# Patient Record
Sex: Male | Born: 1995 | Race: White | Hispanic: No | Marital: Single | Smoking: Never smoker
Health system: Southern US, Community
[De-identification: ages and names within clinical notes are randomized; demographics above are authoritative.]

## PROBLEM LIST (undated history)

## (undated) DIAGNOSIS — Z0000 Encounter for general adult medical examination without abnormal findings: Principal | ICD-10-CM

## (undated) DIAGNOSIS — R61 Generalized hyperhidrosis: Secondary | ICD-10-CM

---

## 2018-01-11 ENCOUNTER — Encounter (HOSPITAL_COMMUNITY): Payer: Self-pay | Admitting: *Deleted

## 2018-01-11 ENCOUNTER — Other Ambulatory Visit: Payer: Self-pay

## 2018-01-11 ENCOUNTER — Emergency Department (HOSPITAL_COMMUNITY): Payer: Managed Care, Other (non HMO)

## 2018-01-11 ENCOUNTER — Emergency Department (HOSPITAL_COMMUNITY)
Admission: EM | Admit: 2018-01-11 | Discharge: 2018-01-12 | Disposition: A | Discharge status: Home / Self Care | Payer: Managed Care, Other (non HMO) | Attending: Emergency Medicine | Admitting: Emergency Medicine

## 2018-01-11 DIAGNOSIS — R079 Chest pain, unspecified: Secondary | ICD-10-CM | POA: Insufficient documentation

## 2018-01-11 DIAGNOSIS — Z5321 Procedure and treatment not carried out due to patient leaving prior to being seen by health care provider: Secondary | ICD-10-CM | POA: Insufficient documentation

## 2018-01-11 LAB — I-STAT TROPONIN, ED: Troponin i, poc: 0 ng/mL (ref 0.00–0.08)

## 2018-01-11 NOTE — ED Triage Notes (Signed)
Pt c/o chest pain under L breast onset around 4pm while driving. Pain worsens with movement

## 2018-01-12 ENCOUNTER — Emergency Department (HOSPITAL_COMMUNITY)
Admission: EM | Admit: 2018-01-12 | Discharge: 2018-01-12 | Disposition: A | Discharge status: Home / Self Care | Payer: Managed Care, Other (non HMO) | Source: Home / Self Care | Attending: Emergency Medicine | Admitting: Emergency Medicine

## 2018-01-12 ENCOUNTER — Encounter (HOSPITAL_COMMUNITY): Payer: Self-pay | Admitting: Emergency Medicine

## 2018-01-12 DIAGNOSIS — R0781 Pleurodynia: Secondary | ICD-10-CM | POA: Insufficient documentation

## 2018-01-12 DIAGNOSIS — R079 Chest pain, unspecified: Secondary | ICD-10-CM

## 2018-01-12 LAB — CBC
HCT: 44.5 % (ref 39.0–52.0)
Hemoglobin: 15 g/dL (ref 13.0–17.0)
MCH: 30 pg (ref 26.0–34.0)
MCHC: 33.7 g/dL (ref 30.0–36.0)
MCV: 89 fL (ref 78.0–100.0)
PLATELETS: 251 10*3/uL (ref 150–400)
RBC: 5 MIL/uL (ref 4.22–5.81)
RDW: 12.9 % (ref 11.5–15.5)
WBC: 9.4 10*3/uL (ref 4.0–10.5)

## 2018-01-12 LAB — BASIC METABOLIC PANEL
ANION GAP: 9 (ref 5–15)
BUN: 17 mg/dL (ref 6–20)
CALCIUM: 10 mg/dL (ref 8.9–10.3)
CO2: 25 mmol/L (ref 22–32)
Chloride: 104 mmol/L (ref 101–111)
Creatinine, Ser: 0.96 mg/dL (ref 0.61–1.24)
GFR calc Af Amer: >60 mL/min (ref >60)
GLUCOSE: 111 mg/dL — AB (ref 65–99)
Potassium: 4.1 mmol/L (ref 3.5–5.1)
Sodium: 138 mmol/L (ref 135–145)

## 2018-01-12 LAB — I-STAT TROPONIN, ED: TROPONIN I, POC: 0 ng/mL (ref 0.00–0.08)

## 2018-01-12 LAB — D-DIMER, QUANTITATIVE: D-Dimer, Quant: <0.27 ug/mL-FEU (ref 0.00–0.50)

## 2018-01-12 MED ORDER — KETOROLAC TROMETHAMINE 30 MG/ML IJ SOLN
30.0000 mg | Freq: Once | INTRAMUSCULAR | Status: AC
  Administered 2018-01-12: 30 mg via INTRAMUSCULAR
  Filled 2018-01-12: qty 1

## 2018-01-12 MED ORDER — NAPROXEN 500 MG PO TABS: 500.0000 mg | ORAL_TABLET | Freq: Two times a day (BID) | ORAL | 0 refills | Status: AC

## 2018-01-12 NOTE — ED Notes (Signed)
Pt reports gradually worsening L sided CP that began yesterday at approx 4 PM while driving home from his HS reunion. Pt denies SOB, N/V, or diaphoresis. Pt reports he was able to manage the pain initially however it began to worsen last night and he wasn't able to sleep. Pt unable to lie flat without significant amount of discomfort.

## 2018-01-12 NOTE — ED Notes (Signed)
Pt ambulatory to room with steady gait

## 2018-01-12 NOTE — ED Notes (Signed)
Pt left with out being seen  [facility] he could not wait / Arm band removed at nurse first

## 2018-01-12 NOTE — ED Triage Notes (Signed)
Pt reports ongoing cp under left breast that began yesterday afternoon, came to ED last night but left before a provider saw him. Pt denies sob but reports pain increases with inspiration.

## 2018-01-12 NOTE — Discharge Instructions (Addendum)
It was my pleasure taking care of you today!   You were seen in the Emergency Department today for chest pain.  As we have discussed, yesterday/today?s blood work and imaging are normal, but you may require further testing.  Please call the cardiology clinic listed to schedule a follow up appointment for further evaluation of the chest pain you experienced today.   Take Naproxen twice daily for 1 week. If symptoms have resolved, you can quit taking this, but continue another week if symptoms persist.   Return to the Emergency Department if you experience any further chest pain/pressure/tightness, difficulty breathing, sudden sweating, or other symptoms that concern you.

## 2018-01-12 NOTE — ED Provider Notes (Signed)
[facility] EMERGENCY DEPARTMENT Provider Note   CSN: [id number] Arrival date & time: 01/12/18  0747     History   Chief Complaint Chief Complaint  Patient presents with  . Chest Pain    HPI Eduardo Hamilton is a 22 y.o. male.  The history is provided by the patient and medical records. No language interpreter was used.    Eduardo Hamilton is an otherwise healthy 22 y.o. male who presents to the Emergency Department complaining of acute onset of left-sided chest pain which began yesterday around 4 PM.  Pain is been progressively worsening.  It is worse with certain movements, inspiration and if trying to lie flat.  He denies any shortness of breath, but does feel as if he is breathing shallow because he knows it will hurt if he takes a deep breath.  He thought initially that this could be heartburn, so he took a Zantac, but this did not provide him any relief.  He denies any abdominal pain, nausea or vomiting.  He does report recent 3 Hour drive to and from high school reunion.  No recent surgeries or immobilizations. No hx of coagulopathy. No hx of HTN, DM, HLD or heart disease. Mother reports that he had a heart murmur as a child that resolved with time and did not require specialty follow up. No fever, chills, cough, congestion. Denies any recent injury or increase in activity. He is a very active 22 year old who kayaks regularly.    History reviewed. No pertinent past medical history.  There are no active problems to display for this patient.   History reviewed. No pertinent surgical history.      Home Medications    Prior to Admission medications   Medication Sig Start Date End Date Taking? Authorizing Provider  naproxen (NAPROSYN) 500 MG tablet Take 1 tablet (500 mg total) by mouth 2 (two) times daily with a meal. 01/12/18   Ward, Chase Picket, PA-C    Family History No family history on file.  Social History Social History   Tobacco Use  . Smoking  status: Never Smoker  . Smokeless tobacco: Never Used  Substance Use Topics  . Alcohol use: Never    Frequency: Never  . Drug use: Never     Allergies   Patient has no known allergies.   Review of Systems Review of Systems  Cardiovascular: Positive for chest pain. Negative for palpitations and leg swelling.  All other systems reviewed and are negative.    Physical Exam Updated Vital Signs BP 135/74   Pulse (!) 53   Temp 98.2 F (36.8 C) (Oral)   Resp (!) 23   SpO2 100%   Physical Exam  Constitutional: He is oriented to person, place, and time. He appears well-developed and well-nourished. No distress.  HENT:  Head: Normocephalic and atraumatic.  Cardiovascular: Normal rate, regular rhythm and normal heart sounds.  No murmur or rub appreciated.  Pulmonary/Chest: Effort normal and breath sounds normal. No respiratory distress.  Tenderness to left chest wall.   Abdominal: Soft. He exhibits no distension. There is no tenderness.  Musculoskeletal: He exhibits no edema.  Neurological: He is alert and oriented to person, place, and time.  Skin: Skin is warm and dry.  Nursing note and vitals reviewed.    ED Treatments / Results  Labs (all labs ordered are listed, but only abnormal results are displayed) Labs Reviewed  D-DIMER, QUANTITATIVE (NOT AT [facility])  I-STAT TROPONIN, ED    EKG  EKG Interpretation  Date/Time:  Monday January 12 2018 07:55:19 EDT Ventricular Rate:  53 PR Interval:  134 QRS Duration: 110 QT Interval:  420 QTC Calculation: 394 R Axis:   84 Text Interpretation:  Sinus bradycardia Incomplete right bundle branch block Cannot rule out Anterior infarct , age undetermined Abnormal ECG Confirmed by Rolland Porter ([id number]) on 01/12/2018 1:42:48 PM   Radiology Dg Chest 2 View  Result Date: 01/12/2018 CLINICAL DATA:  Initial evaluation for acute left chest pain. EXAM: CHEST - 2 VIEW COMPARISON:  None. FINDINGS: The cardiac and mediastinal silhouettes are  within normal limits. The lungs are normally inflated. No airspace consolidation, pleural effusion, or pulmonary edema is identified. There is no pneumothorax. No acute osseous abnormality identified. IMPRESSION: No active cardiopulmonary disease. Electronically Signed   By: Rise Mu M.D.   On: 01/12/2018 00:32    Procedures Procedures (including critical care time)  Medications Ordered in ED Medications  ketorolac (TORADOL) 30 MG/ML injection 30 mg (30 mg Intramuscular Given 01/12/18 1332)     Initial Impression / Assessment and Plan / ED Course  I have reviewed the triage vital signs and the nursing notes.  Pertinent labs & imaging results that were available during my care of the patient were reviewed by me and considered in my medical decision making (see chart for details).     Eduardo Hamilton is a 22 y.o. male who presents to ED for acute onset of left-sided chest pain which began yesterday around 4PM.  Patient came to the emergency department last night where labs were obtained, however he left without being seen.  I have reviewed these labs and they are quite reassuring.  Negative troponin.  Chest x-ray was obtained at that time as well with no acute abnormalities.  On exam today, patient is afebrile, hemodynamically stable with normal cardiopulmonary exam.  He has no murmur or rub.  He does endorse sensation that pain is much worse when lying flat.  Consider pericarditis, however EKG shows no evidence of this.  EKG with no acute ischemic changes as well.  He does have an incomplete right bundle branch.  Troponin and d-dimer here today are negative.  He has a low risk heart score of 1.  He is quite tender to palpation in the area of question.  Differential include musculoskeletal versus possible pericarditis although EKG would suggest this is less likely.  Will treat with trial of NSAIDs and have him follow-up with cardiology.  Patient has been advised to return to the ED if  development of any exertional chest pain, trouble breathing, new/worsening symptoms or for any additional concerns. Evaluation does not show pathology that would require ongoing emergent intervention or inpatient treatment. Referral information included in discharge paperwork. Patient understands return precautions and follow up plan. All questions answered.  Patient discussed with Dr. Fayrene Fearing who agrees with treatment plan.    Final Clinical Impressions(s) / ED Diagnoses   Final diagnoses:  Chest pain  Pleuritic chest pain    ED Discharge Orders        Ordered    naproxen (NAPROSYN) 500 MG tablet  2 times daily with meals     01/12/18 1522       Ward, Chase Picket, PA-C 01/12/18 1634    Rolland Porter, MD 01/17/18 [phone]

## 2019-05-11 IMAGING — DX DG CHEST 2V
2 series · 2 of 2 positions shown · non-contrast
Comparison: None.

CLINICAL DATA: Initial evaluation for acute left chest pain.

EXAM:
CHEST - 2 VIEW

[chest pa]
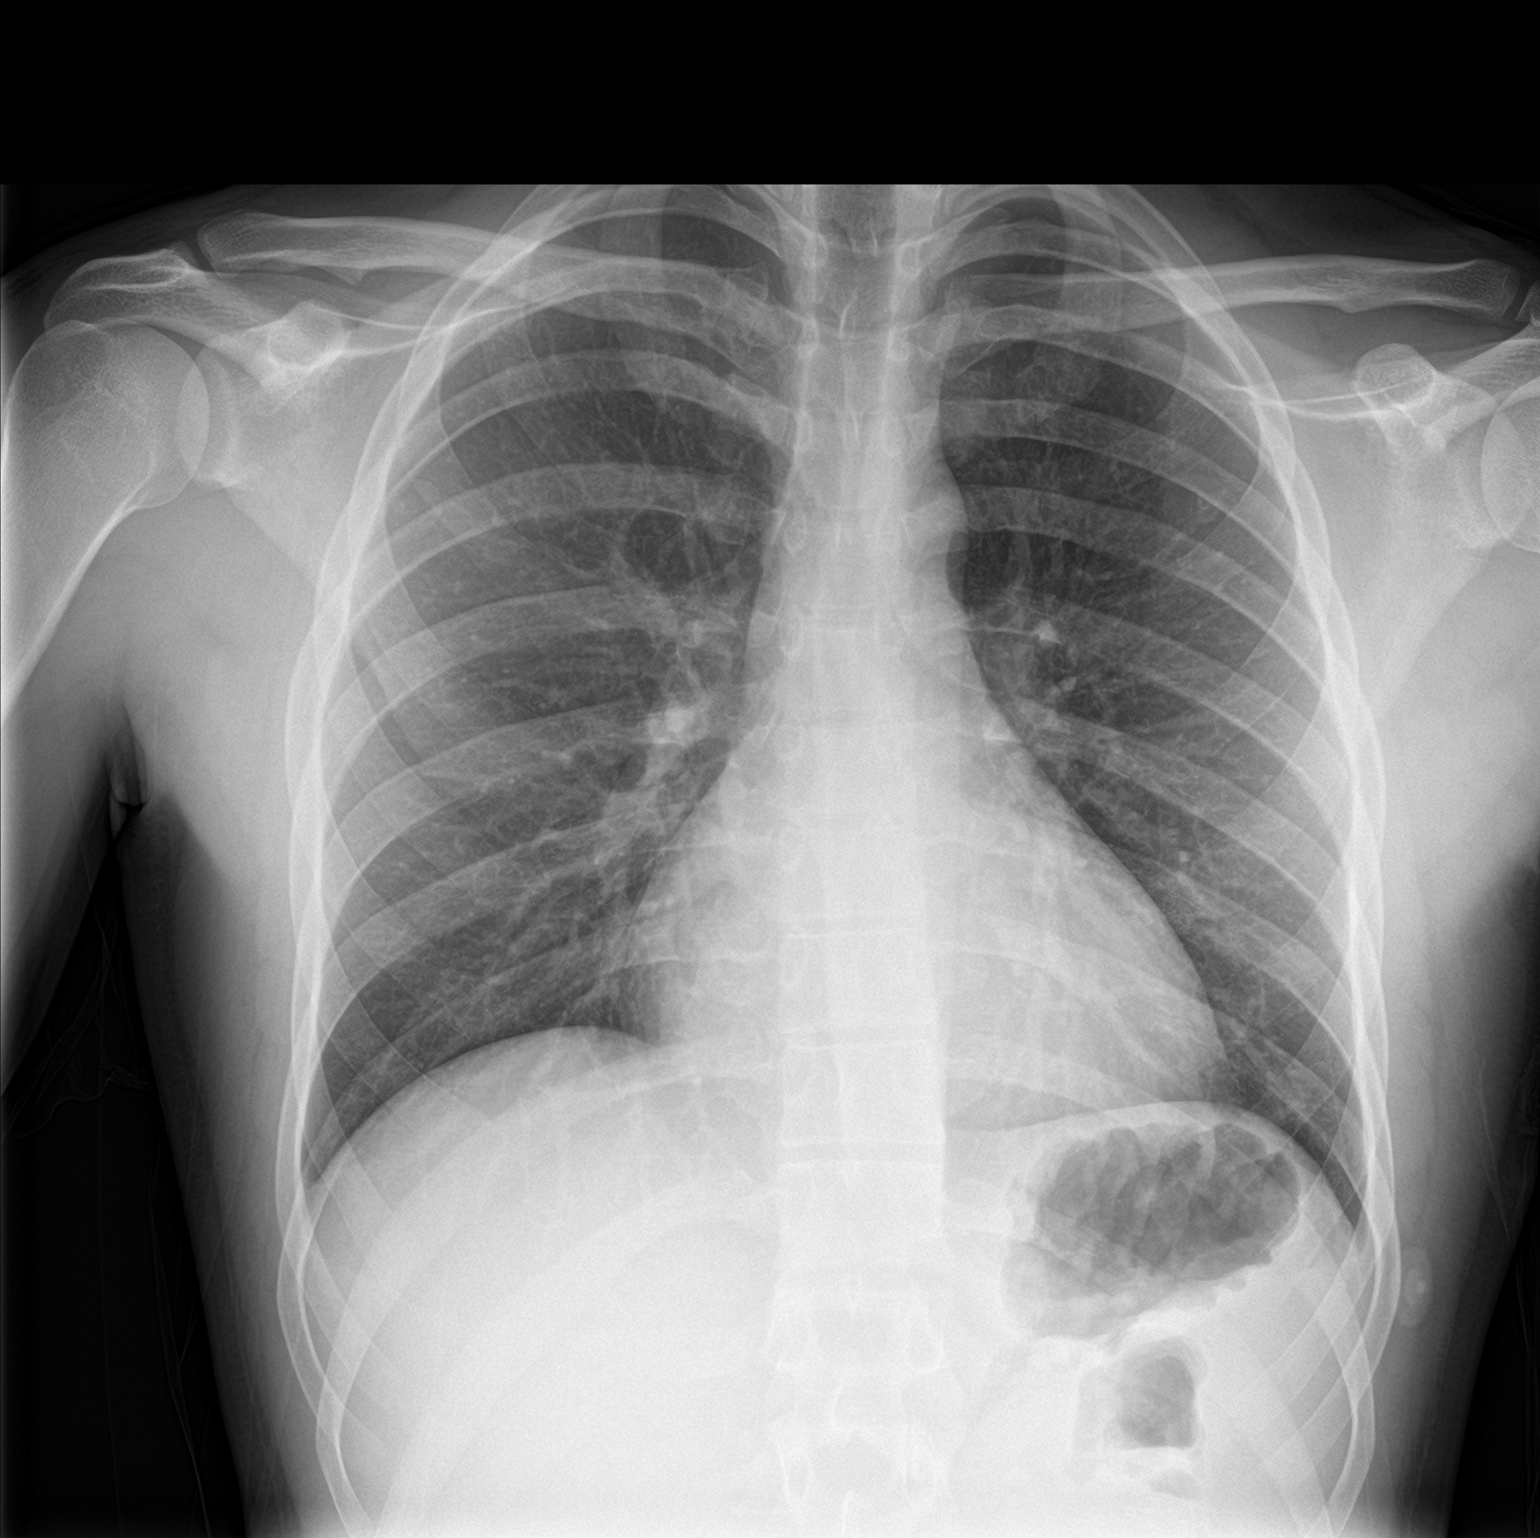

[chest lat]
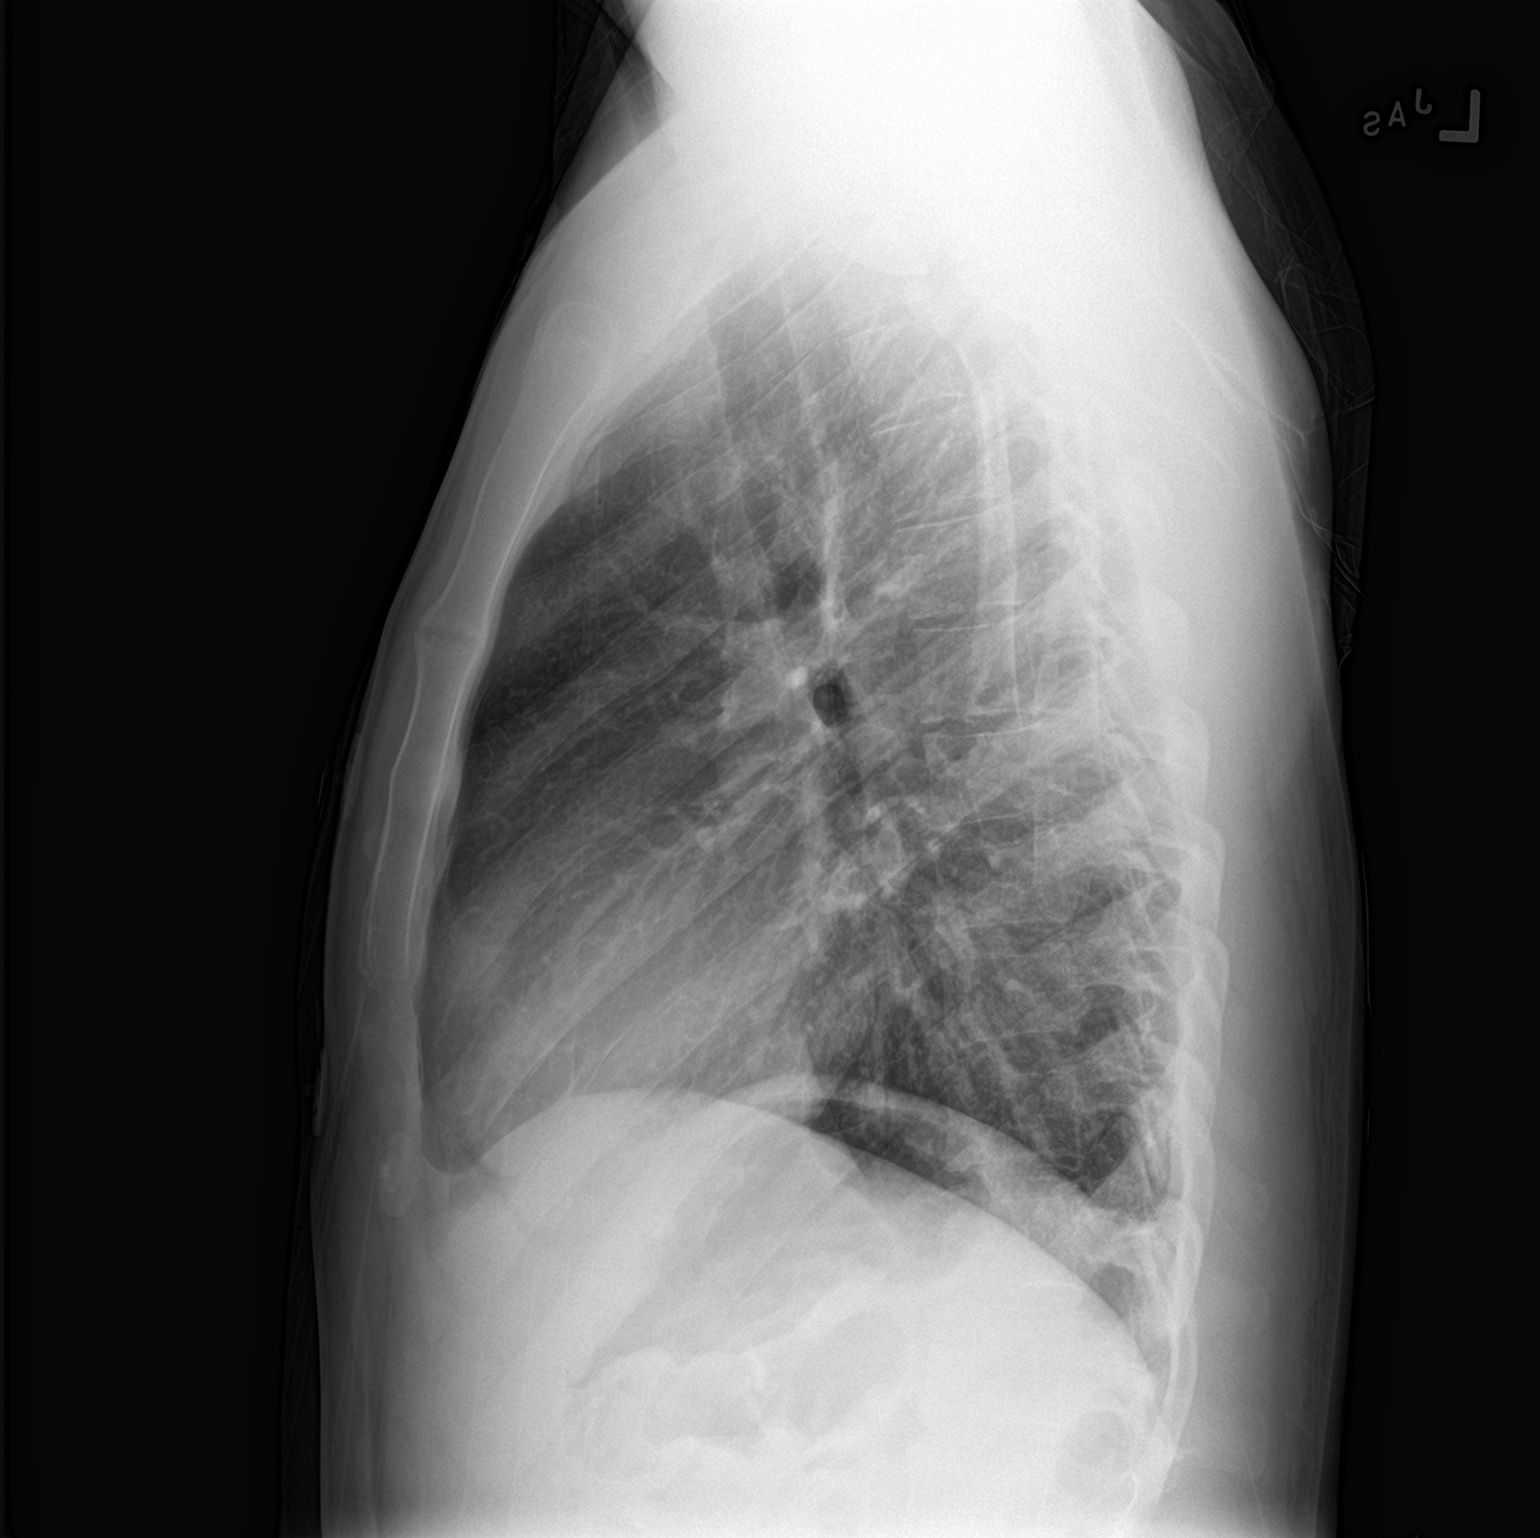

[2 of 2 positions shown; findings below may reference images not displayed]

FINDINGS: The cardiac and mediastinal silhouettes are within normal limits.

The lungs are normally inflated. No airspace consolidation, pleural
effusion, or pulmonary edema is identified. There is no
pneumothorax.

No acute osseous abnormality identified.
IMPRESSION: No active cardiopulmonary disease.

## 2021-05-16 ENCOUNTER — Ambulatory Visit
Admit: 2021-05-16 | Discharge: 2021-05-16 | Payer: PRIVATE HEALTH INSURANCE | Attending: Internal Medicine | Primary: Internal Medicine

## 2021-05-16 DIAGNOSIS — Z0000 Encounter for general adult medical examination without abnormal findings: Secondary | ICD-10-CM

## 2021-05-16 NOTE — Progress Notes (Signed)
David Levy (DOB:  1996-08-18) is a 25 y.o. male,New patient, here for evaluation of the following chief complaint(s):  New Patient (Establish with doctor) and Annual Exam         ASSESSMENT/PLAN:  1. Well adult exam  Check fasting labs  Discussed indications risks versus benefit of up-to-date tetanus vaccination  Start colorectal cancer screening at age 34  Start prostate cancer screening around age 85 unless patient desires sooner    - CBC with Auto Differential; Future  - Comprehensive Metabolic Panel; Future  - Lipid Panel; Future  - T4, Free; Future  - TSH; Future    2. Attention deficit hyperactivity disorder, inattentive type  PHQ-9 score of 2, GAD-7 score of 0 in the office today  Adult self-assessment ADHD questionnaire performed with results reviewed in the office today responses not typical of significant ADD/ADHD  Continue monitoring    3. Labral tear of shoulder, left, sequela  Radiographs of left shoulder from 05/14/2021 with early [facility] changes, no evidence of Bankart lesion bony wise on the glenoid, no obvious Hill-Sachs lesion, lytic blastic change.  Humeral head well-seated in the glenoid  MRI of left shoulder shows evidence of what appears to be a soft tissue Bankart increase fluid in his teardrop inferiorly evidence of posterior capsular stretch; biceps anchor appears to be intact, no other lytic blastic changes  Per orthopedic surgery given patient has failed PT and based on imaging findings likely will require surgical intervention however patient hesitant at this time and plans to do more rehab      No follow-ups on file.         Subjective   SUBJECTIVE/OBJECTIVE:  Patient is a 25 year old Caucasian male who presents to the office today to establish care with a new provider.  Patient has been having some intermittent numbness involving his thumb and ring finger for the past 6 months typically occurring once a week or so and lasting an hour at a time.  No pain, paresthesias or decreased grip  strength.  Has been doing some carpal tunnel stretching exercises which seems to help.  Denies pain, redness or swelling of the joints.  No similar sensation of the toes or preceding trauma or associated neck pain.  Patient generally adheres to a healthy lifestyle and is an avid [employer] and mountain biking 2 days or more week.  Generally has a well-balanced diet stating his wife is very much and healthy eating.  Denies chest pain, palpitations, shortness of breath, abdominal pain, voiding difficulties, anorexia or insomnia.  Does have remote history of ADD/ADHD not currently on medication.  Generally is able to function well at work if he stays busy and maintains productivity.      Review of Systems   Constitutional:  Negative for appetite change.   Respiratory:  Negative for shortness of breath.    Cardiovascular:  Negative for chest pain and palpitations.   Gastrointestinal:  Negative for abdominal pain, anal bleeding, blood in stool, constipation, diarrhea, nausea and vomiting.   Genitourinary:  Negative for dysuria and hematuria.   Musculoskeletal:  Negative for arthralgias, joint swelling and neck pain.   Neurological:  Positive for numbness (Intermittent involving the thumb and ring finger of both hands).   Psychiatric/Behavioral:  Negative for decreased concentration and sleep disturbance.         Objective   Physical Exam  Vitals reviewed.   Constitutional:       General: He is not in acute distress.  Appearance: Normal appearance. He is not ill-appearing, toxic-appearing or diaphoretic.   HENT:      Head: Normocephalic and atraumatic.      Right Ear: Tympanic membrane, ear canal and external ear normal. There is no impacted cerumen.      Left Ear: Tympanic membrane, ear canal and external ear normal. There is no impacted cerumen.      Mouth/Throat:      Mouth: Mucous membranes are moist.      Pharynx: Oropharynx is clear.   Eyes:      General: No visual field deficit.        Right eye: No  discharge.         Left eye: No discharge.      Extraocular Movements: Extraocular movements intact.      Comments: Mild horizontal nystagmus with leftward eye gaze during extraocular muscle testing  Visual fields grossly intact and symmetric bilaterally   Neck:      Thyroid: No thyroid tenderness.      Trachea: No tracheal tenderness, tracheostomy or tracheal deviation.   Cardiovascular:      Rate and Rhythm: Normal rate and regular rhythm.      Heart sounds: No murmur heard.    No friction rub. No gallop.   Pulmonary:      Effort: Pulmonary effort is normal. No respiratory distress.      Breath sounds: No wheezing, rhonchi or rales.   Abdominal:      General: Abdomen is flat.      Palpations: Abdomen is soft.      Tenderness: There is no abdominal tenderness. There is no guarding.   Musculoskeletal:         General: No swelling.      Cervical back: Neck supple. No tenderness.      Right lower leg: No edema.      Left lower leg: No edema.      Comments: Passive range of motion of both wrist preserved in all planes  No tenderness to palpation along first through fifth bilateral MCP, PIP and DIP joints.  No associated joint swelling, redness or warmth  Negative Finkelstein's test bilaterally   Lymphadenopathy:      Cervical:      Right cervical: No superficial cervical adenopathy.     Left cervical: No superficial cervical adenopathy.   Skin:     General: Skin is warm and dry.      Coloration: Skin is not jaundiced.   Neurological:      Mental Status: He is alert.      Cranial Nerves: No cranial nerve deficit, dysarthria or facial asymmetry.      Sensory: Sensation is intact. No sensory deficit.      Motor: No weakness (Muscle strength 5/5 and symmetric in all 4 extremities), tremor or pronator drift.      Coordination: Finger-Nose-Finger Test abnormal (Minimal tremor noted with left-sided finger-to-nose testing but able to perform without difficulty).   Psychiatric:         Attention and Perception: Attention  normal.         Mood and Affect: Mood normal. Mood is not anxious or depressed. Affect is blunt. Affect is not tearful.         Speech: Speech normal. Speech is not rapid and pressured or slurred.         Behavior: Behavior normal. Behavior is not agitated, aggressive or combative. Behavior is cooperative.         Thought  Content: Thought content normal.         Cognition and Memory: Cognition normal.                An electronic signature was used to authenticate this note.    --Rodney Booze, DO

## 2021-06-05 NOTE — Progress Notes (Signed)
Recently had outpatient labs reviewed with results faxed over to the office.  Cholesterol within acceptable limits.  Blood glucose, kidney function, liver enzymes, TSH within normal limits.  CBC within normal limits.

## 2021-06-18 NOTE — Telephone Encounter (Signed)
-----   Message from Rodney Booze, DO sent at 06/05/2021  6:06 PM EDT -----  Regarding: Lab Results  Please call patient and let him know I reviewed his labs and his blood counts, cholesterol, thyroid function, blood sugar, kidney and liver tests are normal. Thanks!    Mellody Dance

## 2021-06-18 NOTE — Telephone Encounter (Signed)
Pt advised  Per Dr Neale Burly  Please call patient and let him know I reviewed his labs and his blood counts, cholesterol, thyroid function, blood sugar, kidney and liver tests are normal.

## 2022-01-10 ENCOUNTER — Encounter

## 2022-01-10 ENCOUNTER — Ambulatory Visit
Admit: 2022-01-10 | Discharge: 2022-01-10 | Payer: PRIVATE HEALTH INSURANCE | Attending: Family | Primary: Internal Medicine

## 2022-01-10 DIAGNOSIS — R61 Generalized hyperhidrosis: Secondary | ICD-10-CM

## 2022-01-10 LAB — URINALYSIS
Bilirubin Urine: NEGATIVE
Blood, Urine: NEGATIVE
Glucose, UA: NEGATIVE mg/dL
Ketones, Urine: NEGATIVE mg/dL
Leukocyte Esterase, Urine: NEGATIVE
Nitrite, Urine: NEGATIVE
Protein, UA: NEGATIVE mg/dL
Specific Gravity, UA: 1.021 (ref 1.001–1.023)
Urobilinogen, Urine: 0.2 EU/dL (ref 0.2–1.0)
pH, Urine: 6 (ref 5.0–9.0)

## 2022-01-10 LAB — COMPREHENSIVE METABOLIC PANEL
ALT: 21 U/L (ref 12–65)
AST: 10 U/L — ABNORMAL LOW (ref 15–37)
Albumin/Globulin Ratio: 1.7 — ABNORMAL HIGH (ref 0.4–1.6)
Albumin: 4.3 g/dL (ref 3.5–5.0)
Alk Phosphatase: 50 U/L (ref 50–136)
Anion Gap: 2 mmol/L (ref 2–11)
BUN: 17 MG/DL (ref 6–23)
CO2: 28 mmol/L (ref 21–32)
Calcium: 9.5 MG/DL (ref 8.3–10.4)
Chloride: 112 mmol/L — ABNORMAL HIGH (ref 101–110)
Creatinine: 1 MG/DL (ref 0.8–1.5)
Est, Glom Filt Rate: >60 mL/min/{1.73_m2} (ref >60)
Globulin: 2.6 g/dL — ABNORMAL LOW (ref 2.8–4.5)
Glucose: 83 mg/dL (ref 65–100)
Potassium: 5 mmol/L (ref 3.5–5.1)
Sodium: 142 mmol/L (ref 133–143)
Total Bilirubin: 0.5 MG/DL (ref 0.2–1.1)
Total Protein: 6.9 g/dL (ref 6.3–8.2)

## 2022-01-10 LAB — CBC WITH AUTO DIFFERENTIAL
Absolute Immature Granulocyte: 0 10*3/uL (ref 0.0–0.5)
Basophils %: 1 % (ref 0.0–2.0)
Basophils Absolute: 0.1 10*3/uL (ref 0.0–0.2)
Eosinophils %: 3 % (ref 0.5–7.8)
Eosinophils Absolute: 0.2 10*3/uL (ref 0.0–0.8)
Hematocrit: 46.3 % (ref 41.1–50.3)
Hemoglobin: 15.1 g/dL (ref 13.6–17.2)
Immature Granulocytes: 0 % (ref 0.0–5.0)
Lymphocytes %: 39 % (ref 13–44)
Lymphocytes Absolute: 1.9 10*3/uL (ref 0.5–4.6)
MCH: 30 PG (ref 26.1–32.9)
MCHC: 32.6 g/dL (ref 31.4–35.0)
MCV: 91.9 FL (ref 82–102)
MPV: 11.5 FL (ref 9.4–12.3)
Monocytes %: 9 % (ref 4.0–12.0)
Monocytes Absolute: 0.5 10*3/uL (ref 0.1–1.3)
Neutrophils %: 48 % (ref 43–78)
Neutrophils Absolute: 2.3 10*3/uL (ref 1.7–8.2)
Platelets: 249 10*3/uL (ref 150–450)
RBC: 5.04 M/uL (ref 4.23–5.6)
RDW: 13.2 % (ref 11.9–14.6)
WBC: 4.9 10*3/uL (ref 4.3–11.1)
nRBC: 0 10*3/uL (ref 0.0–0.2)

## 2022-01-10 LAB — TSH: TSH, 3RD GENERATION: 0.76 u[IU]/mL (ref 0.358–3.740)

## 2022-01-10 NOTE — Progress Notes (Signed)
[facility]      01/10/2022    Patient Name: David Levy  DOB:  [birth year]      Chief Complaint:  Chief Complaint   Patient presents with    Sweats     Pt c/o night sweats x3 years.           HPI  Patient presents today with complaint of night sweats. Symptoms started about 3 years ago. Night sweats occur about 4-5 night weekly. He denies any recent fever or illness. He denies any associated unintentional weight loss, fatigue, enlarged lymph nodes, palpitations, cough, rash, abdominal pain, diarrhea, headache.     Non-smoker. Denies excessive alcohol use. Denies anxiety, depression. No GERD symptoms.     Denies any changes to past medical history. He does not take any medications routinely.         Past Medical History:   Diagnosis Date    ADHD (attention deficit hyperactivity disorder)        Past Surgical History:   Procedure Laterality Date    WISDOM TOOTH EXTRACTION         Family History   Problem Relation Age of Onset    No Known Problems Mother     No Known Problems Father     High Cholesterol Brother     No Known Problems Brother     No Known Problems Brother     Glaucoma Paternal Grandmother     Prostate Cancer Paternal Grandfather 19       Social History     Tobacco Use    Smoking status: Never    Smokeless tobacco: Former   Haematologist Use: Never used   Substance Use Topics    Alcohol use: Yes     Comment: 5 per week    Drug use: Never       No current outpatient medications on file.    No Known Allergies    Review of Systems   Constitutional:  Positive for diaphoresis. Negative for activity change, appetite change, chills, fatigue, fever and unexpected weight change.   Psychiatric/Behavioral:  Negative for dysphoric mood. The patient is not nervous/anxious.        Objective:  BP 116/76   Pulse 61   Temp 98.6 F (37 C) (Temporal)   Resp 16   Ht 6\' 2"  (1.88 m)   Wt 182 lb 12.8 oz (82.9 kg)   SpO2 99%   BMI 23.47 kg/m     Examination:  Physical Exam  Vitals and nursing note  reviewed.   Constitutional:       General: He is not in acute distress.     Appearance: Normal appearance.   HENT:      Right Ear: Tympanic membrane, ear canal and external ear normal.      Left Ear: Tympanic membrane, ear canal and external ear normal.      Nose: Nose normal.      Mouth/Throat:      Mouth: Mucous membranes are moist.      Pharynx: Oropharynx is clear.   Eyes:      General: No scleral icterus.     Extraocular Movements: Extraocular movements intact.      Conjunctiva/sclera: Conjunctivae normal.      Pupils: Pupils are equal, round, and reactive to light.   Cardiovascular:      Rate and Rhythm: Normal rate and regular rhythm.      Pulses:  Dorsalis pedis pulses are 2+ on the right side and 2+ on the left side.        Posterior tibial pulses are 2+ on the right side and 2+ on the left side.      Heart sounds: Normal heart sounds.   Pulmonary:      Effort: Pulmonary effort is normal. No respiratory distress.      Breath sounds: Normal breath sounds. No wheezing or rales.   Abdominal:      General: Abdomen is flat. Bowel sounds are normal. There is no distension.      Palpations: Abdomen is soft.      Tenderness: There is no abdominal tenderness.   Musculoskeletal:      Right lower leg: No edema.      Left lower leg: No edema.   Lymphadenopathy:      Cervical: No cervical adenopathy.   Skin:     General: Skin is warm and dry.   Neurological:      General: No focal deficit present.      Mental Status: He is alert and oriented to person, place, and time.   Psychiatric:         Mood and Affect: Mood normal.         Behavior: Behavior normal.         Thought Content: Thought content normal.         Assessment/Plan:    Navjot was seen today for sweats.    Diagnoses and all orders for this visit:    Night sweats  -     CBC with Auto Differential; Future  -     TSH; Future  -     Comprehensive Metabolic Panel; Future  -     Urinalysis; Future  Labs today. Plan pending results.           On this  date, 01/10/22, I have spent 30 minutes reviewing previous notes, test results and face to face with the patient discussing the diagnosis and importance of compliance with the treatment plan as well as documenting on the day of the visit.     An electronic signature was used to authenticate this note.  Jeanmarie Hubert, NP-C

## 2022-05-17 ENCOUNTER — Ambulatory Visit
Admit: 2022-05-17 | Discharge: 2022-05-17 | Payer: PRIVATE HEALTH INSURANCE | Attending: Internal Medicine | Primary: Internal Medicine

## 2022-05-17 DIAGNOSIS — Z0000 Encounter for general adult medical examination without abnormal findings: Secondary | ICD-10-CM

## 2022-05-17 NOTE — Progress Notes (Unsigned)
Dael Cannada (DOB:  1995-11-14) is a 26 y.o. male,Established patient, here for evaluation of the following chief complaint(s):  No chief complaint on file.         ASSESSMENT/PLAN:  1. Well adult exam  Check fasting labs  Discussed indications, risk versus benefit of up-to-date tetanus vaccination  Counseled on healthy lifestyle choices including limiting consumption of fried foods and excessive carbohydrates and increasing physical activity with goal of 30 minutes of moderate exercise 4 to 5 days/week as tolerated        No follow-ups on file.         Subjective   SUBJECTIVE/OBJECTIVE:  HPI    Review of Systems       Objective   Physical Exam       {Time Documentation Optional:[id number]}      An electronic signature was used to authenticate this note.    --Rodney Booze, DO

## 2022-06-12 ENCOUNTER — Encounter: Payer: PRIVATE HEALTH INSURANCE | Primary: Internal Medicine

## 2023-05-14 ENCOUNTER — Other Ambulatory Visit: Admit: 2023-05-14 | Discharge: 2023-05-14 | Payer: PRIVATE HEALTH INSURANCE | Primary: Internal Medicine

## 2023-05-14 ENCOUNTER — Encounter: Payer: PRIVATE HEALTH INSURANCE | Primary: Internal Medicine

## 2023-05-14 DIAGNOSIS — Z0000 Encounter for general adult medical examination without abnormal findings: Secondary | ICD-10-CM

## 2023-05-14 LAB — LIPID PANEL
Chol/HDL Ratio: 2.5 (ref 0.0–5.0)
Cholesterol, Total: 158 mg/dL (ref 0–200)
HDL: 64 mg/dL — ABNORMAL HIGH (ref 40–60)
LDL Cholesterol: 76 mg/dL (ref 0–100)
Triglycerides: 89 mg/dL (ref 0–150)
VLDL Cholesterol Calculated: 18 mg/dL (ref 6–23)

## 2023-05-15 LAB — COMPREHENSIVE METABOLIC PANEL
ALT: 17 U/L (ref 12–65)
AST: 19 U/L (ref 15–37)
Albumin/Globulin Ratio: 1.9 (ref 1.0–1.9)
Albumin: 4.5 g/dL (ref 3.5–5.0)
Alk Phosphatase: 45 U/L (ref 40–129)
Anion Gap: 15 mmol/L (ref 9–18)
BUN: 14 mg/dL (ref 6–23)
CO2: 23 mmol/L (ref 20–28)
Calcium: 9.9 mg/dL (ref 8.8–10.2)
Chloride: 103 mmol/L (ref 98–107)
Creatinine: 1.1 mg/dL (ref 0.80–1.30)
Est, Glom Filt Rate: >90 mL/min/{1.73_m2} (ref >60)
Globulin: 2.4 g/dL (ref 2.3–3.5)
Glucose: 92 mg/dL (ref 70–99)
Potassium: 4.8 mmol/L (ref 3.5–5.1)
Sodium: 141 mmol/L (ref 136–145)
Total Bilirubin: 0.5 mg/dL (ref 0.0–1.2)
Total Protein: 7 g/dL (ref 6.3–8.2)

## 2023-05-15 LAB — TSH: TSH, 3rd Generation: 1.55 u[IU]/mL (ref 0.270–4.200)

## 2023-05-21 ENCOUNTER — Ambulatory Visit
Admit: 2023-05-21 | Discharge: 2023-05-21 | Payer: PRIVATE HEALTH INSURANCE | Attending: Internal Medicine | Primary: Internal Medicine

## 2023-05-21 ENCOUNTER — Other Ambulatory Visit: Admit: 2023-05-21 | Discharge: 2023-05-21 | Payer: PRIVATE HEALTH INSURANCE | Primary: Internal Medicine

## 2023-05-21 VITALS — BP 128/86 | HR 63 | Temp 97.50000°F | Ht 74.0 in | Wt 184.8 lb

## 2023-05-21 DIAGNOSIS — Z0000 Encounter for general adult medical examination without abnormal findings: Secondary | ICD-10-CM

## 2023-05-21 LAB — CBC WITH AUTO DIFFERENTIAL
Basophils %: 1 % (ref 0.0–2.0)
Basophils Absolute: 0.1 10*3/uL (ref 0.0–0.2)
Eosinophils %: 3 % (ref 0.5–7.8)
Eosinophils Absolute: 0.1 10*3/uL (ref 0.0–0.8)
Hematocrit: 46.8 % (ref 41.1–50.3)
Hemoglobin: 15 g/dL (ref 13.6–17.2)
Immature Granulocytes %: 0 % (ref 0.0–5.0)
Immature Granulocytes Absolute: 0 10*3/uL (ref 0.0–0.5)
Lymphocytes %: 36 % (ref 13–44)
Lymphocytes Absolute: 1.9 10*3/uL (ref 0.5–4.6)
MCH: 29.6 pg (ref 26.1–32.9)
MCHC: 32.1 g/dL (ref 31.4–35.0)
MCV: 92.3 FL (ref 82–102)
MPV: 11 FL (ref 9.4–12.3)
Monocytes %: 8 % (ref 4.0–12.0)
Monocytes Absolute: 0.4 10*3/uL (ref 0.1–1.3)
Neutrophils %: 52 % (ref 43–78)
Neutrophils Absolute: 2.8 10*3/uL (ref 1.7–8.2)
Platelets: 268 10*3/uL (ref 150–450)
RBC: 5.07 M/uL (ref 4.23–5.6)
RDW: 12.7 % (ref 11.9–14.6)
WBC: 5.3 10*3/uL (ref 4.3–11.1)
nRBC: 0 10*3/uL (ref 0.0–0.2)

## 2023-05-21 NOTE — Progress Notes (Signed)
 David Levy (DOB:  [birth year]) is a 27 y.o. male,Established patient, here for evaluation of the following chief complaint(s):  Annual Exam (Pt is here for his yearly physical and lab review. )         Assessment & Plan  Well adult exam  Fasting labs from 05/14/2023 reviewed in the office today  Start colorectal cancer screening at age 40  Start prostate cancer screening around age 75 unless patient desires sooner  Patient up-to-date on tetanus vaccination.  Believes he is up-to-date on hepatitis B vaccinations having received standard immunizations prior to school  Declines influenza vaccination  Continue regular physical activity.  Encouraged on average 7 to 8 hours of sleep at night if possible  Up-to-date on eye exam    Orders:    CBC with Auto Differential; Future      Return in about 1 year (around 05/20/2024) for physical; nonfasting lab today .       Subjective   Patient is a 27 year old Caucasian male who presents to the office today for his annual physical.  Patient overall is doing well.  Still exercises regularly and is training for a triathlon later this month.  Up-to-date on eye exam without changes to his contact lens prescription.  Still has some chronic night sweats occurring on average a few times a month but does not desire additional workup.  No lymphadenopathy, fevers, chills, chest pain or shortness of breath.  Denies headaches, abdominal pain, voiding symptoms, dizziness or lightheadedness.  Averages around 6 hours of sleep at night but this is because he has to get up early to travel an hour for work.  No significant loss of appetite.  Stays well-hydrated.        Review of Systems   Constitutional:  Positive for diaphoresis (Chronic intermittent night sweats). Negative for appetite change, chills and fever.        Denies lymphadenopathy   Gastrointestinal:  Negative for abdominal pain, anal bleeding, blood in stool, constipation, diarrhea, nausea and vomiting.   Genitourinary:  Negative  for dysuria and hematuria.   Neurological:  Negative for dizziness, light-headedness and headaches.   Psychiatric/Behavioral:  Negative for sleep disturbance.           Objective   Physical Exam  Vitals reviewed.   Constitutional:       General: He is not in acute distress.     Appearance: Normal appearance. He is not ill-appearing, toxic-appearing or diaphoretic.   HENT:      Head: Normocephalic and atraumatic.      Mouth/Throat:      Mouth: Mucous membranes are moist.      Pharynx: Oropharynx is clear. No oropharyngeal exudate or posterior oropharyngeal erythema.   Eyes:      General: No visual field deficit.     Extraocular Movements: Extraocular movements intact.      Comments: Visual fields grossly intact bilaterally   Neck:      Thyroid: No thyroid tenderness.      Trachea: No tracheal tenderness, tracheostomy or tracheal deviation.   Cardiovascular:      Rate and Rhythm: Normal rate and regular rhythm.      Heart sounds: No murmur heard.     No friction rub. No gallop.   Pulmonary:      Effort: Pulmonary effort is normal. No respiratory distress.      Breath sounds: No wheezing, rhonchi or rales.   Abdominal:      Palpations: Abdomen is soft.  Tenderness: There is no abdominal tenderness. There is no right CVA tenderness, left CVA tenderness or guarding.   Musculoskeletal:      Cervical back: Neck supple. No tenderness.      Right lower leg: No edema.      Left lower leg: No edema.   Lymphadenopathy:      Cervical:      Right cervical: No superficial cervical adenopathy.     Left cervical: No superficial cervical adenopathy.   Skin:     General: Skin is dry.      Coloration: Skin is not jaundiced.   Neurological:      Mental Status: He is alert.      Cranial Nerves: No cranial nerve deficit, dysarthria or facial asymmetry.      Sensory: Sensation is intact.      Motor: No weakness (Muscle strength 5/5 in all 4 extremities), tremor or pronator drift.      Coordination: Finger-Nose-Finger Test normal.    Psychiatric:         Attention and Perception: Attention normal.         Mood and Affect: Mood and affect normal. Mood is not anxious or depressed. Affect is not tearful.         Speech: Speech normal. Speech is not rapid and pressured or slurred.         Behavior: Behavior normal. Behavior is not agitated, aggressive or combative. Behavior is cooperative.         Thought Content: Thought content normal.                  An electronic signature was used to authenticate this note.    --Francis Meissner, DO

## 2024-04-13 NOTE — Telephone Encounter (Signed)
 Called patient and rescheduled appts. Also scheduled new patient appt with Dr. Alethia

## 2024-04-13 NOTE — Telephone Encounter (Signed)
-----   Message from Darline T sent at 04/13/2024  8:50 AM EDT -----  Regarding: [facility] Appointment Request  [facility] Appointment Request    Patient needs appointment for [facility] Appointment Type: New to Provider.    Patient Requested Dates(s): Anytime  Patient Requested Time:  prefer morning  Provider Name: Any doctor in the practice    Reason for Appointment Request: Established Patient - No appointments available during search  Patient wants to establish care any doctor in the practice.  --------------------------------------------------------------------------------------------------------------------------    Relationship to Patient: Self     Call Back Information: OK to leave message on voicemail  Preferred Call Back Number: Phone [phone]

## 2024-05-14 ENCOUNTER — Encounter

## 2024-05-26 ENCOUNTER — Other Ambulatory Visit: Admit: 2024-05-26 | Payer: PRIVATE HEALTH INSURANCE | Primary: Internal Medicine

## 2024-05-26 DIAGNOSIS — Z0000 Encounter for general adult medical examination without abnormal findings: Principal | ICD-10-CM

## 2024-05-26 LAB — CBC WITH AUTO DIFFERENTIAL
Basophils %: 1.4 % (ref 0.0–2.0)
Basophils Absolute: 0.08 K/UL (ref 0.00–0.20)
Eosinophils %: 2.2 % (ref 0.5–7.8)
Eosinophils Absolute: 0.12 K/UL (ref 0.00–0.80)
Hematocrit: 48.3 % (ref 41.1–50.3)
Hemoglobin: 16 g/dL (ref 13.6–17.2)
Immature Granulocytes %: 0.2 % (ref 0.0–5.0)
Immature Granulocytes Absolute: 0.01 K/UL (ref 0.0–0.5)
Lymphocytes %: 17.9 % (ref 13.0–44.0)
Lymphocytes Absolute: 0.99 K/UL (ref 0.50–4.60)
MCH: 30.4 pg (ref 26.1–32.9)
MCHC: 33.1 g/dL (ref 31.4–35.0)
MCV: 91.7 FL (ref 82–102)
MPV: 11.5 FL (ref 9.4–12.3)
Monocytes %: 12.5 % — ABNORMAL HIGH (ref 4.0–12.0)
Monocytes Absolute: 0.69 K/UL (ref 0.10–1.30)
Neutrophils %: 65.8 % (ref 43.0–78.0)
Neutrophils Absolute: 3.64 K/UL (ref 1.70–8.20)
Platelets: 222 K/uL (ref 150–450)
RBC: 5.27 M/uL (ref 4.23–5.6)
RDW: 12.9 % (ref 11.9–14.6)
WBC: 5.5 K/uL (ref 4.3–11.1)
nRBC: 0 K/uL (ref 0.0–0.2)

## 2024-05-26 LAB — COMPREHENSIVE METABOLIC PANEL
ALT: 23 U/L (ref 8–55)
AST: 21 U/L (ref 15–37)
Albumin/Globulin Ratio: 1.5 (ref 1.0–1.9)
Albumin: 4.5 g/dL (ref 3.5–5.0)
Alk Phosphatase: 53 U/L (ref 40–129)
Anion Gap: 13 mmol/L (ref 7–16)
BUN: 17 mg/dL (ref 6–23)
CO2: 24 mmol/L (ref 20–29)
Calcium: 10 mg/dL (ref 8.8–10.2)
Chloride: 101 mmol/L (ref 98–107)
Creatinine: 1.13 mg/dL (ref 0.80–1.30)
Est, Glom Filt Rate: 90 ml/min/1.73m2 (ref >60)
Globulin: 2.9 g/dL (ref 2.3–3.5)
Glucose: 100 mg/dL — ABNORMAL HIGH (ref 70–99)
Potassium: 4.5 mmol/L (ref 3.5–5.1)
Sodium: 138 mmol/L (ref 136–145)
Total Bilirubin: 0.5 mg/dL (ref 0.0–1.2)
Total Protein: 7.4 g/dL (ref 6.3–8.2)

## 2024-05-26 LAB — HEPATITIS C ANTIBODY: Hepatitis C Ab: NONREACTIVE

## 2024-05-26 LAB — LIPID PANEL
Chol/HDL Ratio: 2.2 (ref 0.0–5.0)
Cholesterol, Total: 165 mg/dL (ref 0–200)
HDL: 75 mg/dL — ABNORMAL HIGH (ref 40–60)
LDL Cholesterol: 73 mg/dL (ref 0–100)
Triglycerides: 88 mg/dL (ref 0–150)
VLDL Cholesterol Calculated: 18 mg/dL (ref 6–23)

## 2024-05-26 LAB — TSH: TSH, 3rd Generation: 1.21 u[IU]/mL (ref 0.270–4.200)

## 2024-05-26 LAB — HIV 1/2 AG/AB, 4TH GENERATION,W RFLX CONFIRM: HIV 1/2 Interp: NONREACTIVE

## 2024-06-02 ENCOUNTER — Encounter: Payer: BLUE CROSS/BLUE SHIELD | Attending: Physician Assistant | Primary: Internal Medicine

## 2024-06-02 ENCOUNTER — Encounter: Payer: BLUE CROSS/BLUE SHIELD | Attending: Internal Medicine | Primary: Internal Medicine

## 2024-06-08 ENCOUNTER — Ambulatory Visit
Admit: 2024-06-08 | Discharge: 2024-06-08 | Payer: BLUE CROSS/BLUE SHIELD | Attending: Physician Assistant | Primary: Internal Medicine

## 2024-06-08 VITALS — BP 110/72 | HR 59 | Temp 97.40000°F | Resp 18 | Ht 73.5 in | Wt 176.2 lb

## 2024-06-08 DIAGNOSIS — Z0000 Encounter for general adult medical examination without abnormal findings: Principal | ICD-10-CM

## 2024-06-08 MED ORDER — KETOCONAZOLE 2 % EX SHAM
2 | CUTANEOUS | 0 refills | Status: AC
Start: 2024-06-08 — End: ?

## 2024-06-08 NOTE — Progress Notes (Signed)
 Well Adult Note  Name: David Levy Unijb'd Date: 06/08/2024   MRN: [id number] Sex: Male   Age: 28 y.o. Ethnicity: Non-Hispanic / Non Latino   DOB: Nov 01, 1995 Race: White (non-Hispanic)      Barett Whidbee is here for a well adult exam.          ASSESSMENT AND PLAN:   Encounter for well adult exam without abnormal findings  BMI 22.0-22.9, adult  Tinea versicolor  -     ketoconazole  (NIZORAL ) 2 % shampoo; Apply topically daily x 1 month, leave on skin for at least 5 minutes before rinsing, Disp-120 mL, R-0, Normal    I reviewed all lab results w/ him, which were normal. Will treat tinea versicolor with ketoconazole  shampoo. Follow up if not improving over the next several months.      Return in 1 year (on 06/08/2025) for CPE (Physical Exam), med refills, get fasting labs prior to appt.     Subjective   History of Present Illness  The patient presents for a routine checkup    Health Maintenance reviewed - Flu shot given, exercise for at least 150 min/wk, annual dental and eye exams.     About a month ago, he noticed a non-itchy rash on his back and abdomen. The rash seems to worsen after using the sauna. The rash is not itchy and has not changed in appearance      Lab Results   Component Value Date    WBC 5.5 05/26/2024    HGB 16.0 05/26/2024    HCT 48.3 05/26/2024    MCV 91.7 05/26/2024    PLT 222 05/26/2024     Lab Results   Component Value Date    NA 138 05/26/2024    K 4.5 05/26/2024    CL 101 05/26/2024    CO2 24 05/26/2024    BUN 17 05/26/2024    CREATININE 1.13 05/26/2024    GLUCOSE 100 (H) 05/26/2024    CALCIUM 10.0 05/26/2024    BILITOT 0.5 05/26/2024    ALKPHOS 53 05/26/2024    AST 21 05/26/2024    ALT 23 05/26/2024    LABGLOM 90 05/26/2024    GLOB 2.9 05/26/2024       Lab Results   Component Value Date    CHOL 165 05/26/2024    TRIG 88 05/26/2024    HDL 75 (H) 05/26/2024    LDL 73 05/26/2024    VLDL 18 05/26/2024    CHOLHDLRATIO 2.2 05/26/2024     Lab Results   Component Value Date    TSH 1.210 05/26/2024        Review of Systems   Constitutional:  Negative for chills, fatigue, fever and unexpected weight change.   HENT: Negative.     Respiratory: Negative.     Cardiovascular: Negative.    Gastrointestinal: Negative.    Genitourinary: Negative.    Musculoskeletal: Negative.    Skin:  Positive for rash.   Neurological:  Negative for dizziness and headaches.   Psychiatric/Behavioral:  Negative for dysphoric mood and sleep disturbance. The patient is not nervous/anxious.         No Known Allergies  Prior to Visit Medications   Medication Sig Taking? Authorizing Provider   ketoconazole  (NIZORAL ) 2 % shampoo Apply topically daily x 1 month, leave on skin for at least 5 minutes before rinsing Yes Rollene Bleacher, PA-C     Past Medical History:   Diagnosis Date    ADHD (attention deficit hyperactivity disorder)  Past Surgical History:   Procedure Laterality Date    WISDOM TOOTH EXTRACTION       Family History   Problem Relation Age of Onset    No Known Problems Mother     No Known Problems Father     High Cholesterol Brother     No Known Problems Brother     No Known Problems Brother     Glaucoma Paternal Grandmother     Prostate Cancer Paternal Grandfather 9     Social History     Tobacco Use    Smoking status: Never    Smokeless tobacco: Former   Haematologist status: Never Used   Substance Use Topics    Alcohol use: Yes     Alcohol/week: 4.0 standard drinks of alcohol     Types: 4 Cans of beer per week     Comment: 5 per week    Drug use: Never        Objective    Vital Signs  BP 110/72 (BP Site: Left Upper Arm, Patient Position: Sitting, BP Cuff Size: Medium Adult)   Pulse 59   Temp 97.4 F (36.3 C) (Temporal)   Resp 18   Ht 1.867 m (6' 1.5)   Wt 79.9 kg (176 lb 3.2 oz)   SpO2 99%   BMI 22.93 kg/m     Wt Readings from Last 3 Encounters:   06/08/24 79.9 kg (176 lb 3.2 oz)   05/21/23 83.8 kg (184 lb 12.8 oz)   05/17/22 82.5 kg (181 lb 12.8 oz)       Physical Exam  Vitals reviewed.   Constitutional:        Appearance: Normal appearance. He is normal weight.   HENT:      Head: Normocephalic and atraumatic.      Right Ear: Tympanic membrane, ear canal and external ear normal.      Left Ear: Tympanic membrane, ear canal and external ear normal.      Mouth/Throat:      Lips: Pink.      Mouth: Mucous membranes are moist.   Eyes:      Conjunctiva/sclera: Conjunctivae normal.   Cardiovascular:      Rate and Rhythm: Normal rate and regular rhythm.      Heart sounds: Normal heart sounds. No murmur heard.  Pulmonary:      Effort: Pulmonary effort is normal.      Breath sounds: Normal breath sounds and air entry.   Abdominal:      General: Abdomen is flat. Bowel sounds are normal.      Palpations: Abdomen is soft.      Tenderness: There is no abdominal tenderness.      Hernia: No hernia is present.   Musculoskeletal:      Cervical back: Neck supple.      Right lower leg: No edema.      Left lower leg: No edema.   Lymphadenopathy:      Cervical: No cervical adenopathy.   Skin:     General: Skin is warm and dry.      Findings: Rash present.      Comments: Few tan/pink patches on abdomen that look consistent with tinea versicolor   Neurological:      Mental Status: He is alert and oriented to person, place, and time.      Gait: Gait is intact.   Psychiatric:         Attention and Perception:  Attention normal.         Mood and Affect: Mood and affect normal.         Speech: Speech normal.         Behavior: Behavior normal.         Cognition and Memory: Memory normal.              Personalized Preventive Plan  Current Health Maintenance Status  Immunization History   Administered Date(s) Administered    Influenza, FLUCELVAX, (age 11 mo+) IM, Trivalent PF, 0.5mL 06/08/2024        Health Maintenance Due   Topic Date Due    Varicella vaccine (1 of 2 - 13+ 2-dose series) Never done    Hepatitis B vaccine (1 of 3 - 19+ 3-dose series) Never done      Recommendations for Preventive Services Due: see orders and patient  instructions/AVS.    The patient (or guardian, if applicable) and other individuals in attendance with the patient were advised that Artificial Intelligence will be utilized during this visit to record and process the conversation to generate a clinical note. The patient (or guardian, if applicable) and other individuals in attendance at the appointment consented to the use of AI, including the recording.

## 2024-06-08 NOTE — Patient Instructions (Signed)
 Well Visit, Ages 28 to 78: Care Instructions  Well visits can help you stay healthy. Your doctor has checked your overall health and may have suggested ways to take good care of yourself. Your doctor also may have recommended tests. You can help prevent illness with healthy eating, good sleep, vaccinations, regular exercise, and other steps.    Get the tests that you and your doctor decide on. Depending on your age and risks, examples might include screening for diabetes; hepatitis C; HIV; and cervical, breast, lung, and colon cancer. Screening helps find diseases before any symptoms appear.   Eat healthy foods. Choose fruits, vegetables, whole grains, lean protein, and low-fat dairy foods. Limit saturated fat and reduce salt.     Limit alcohol. Men should have no more than 2 drinks a day. Women should have no more than 1. For some people, no alcohol is the best choice.   Exercise. Get at least 30 minutes of exercise on most days of the week. Walking can be a good choice.     Reach and stay at your healthy weight. This will lower your risk for many health problems.   Take care of your mental health. Try to stay connected with friends, family, and community, and find ways to manage stress.     If you're feeling depressed or hopeless, talk to someone. A counselor can help. If you don't have a counselor, talk to your doctor.   Talk to your doctor if you think you may have a problem with alcohol or drug use. This includes prescription medicines, marijuana, and other drugs.     Avoid tobacco and nicotine: Don't smoke, vape, or chew. If you need help quitting, talk to your doctor.   Practice safer sex. Getting tested, using condoms or dental dams, and limiting sex partners can help prevent STIs.     Use birth control if it's important to you to prevent pregnancy. Talk with your doctor about your choices and what might be best for you.   Prevent problems where you can. Protect your skin from too much sun, wash your  hands, brush your teeth twice a day, and wear a seat belt in the car.   Where can you learn more?  Go to [employer] and enter P072 to learn more about Well Visit, Ages 25 to 39: Care Instructions.  Current as of: March 16, 2024  Content Version: 14.6   2024-2025 [place], [place].   Care instructions adapted under license by [facility]. If you have questions about a medical condition or this instruction, always ask your healthcare professional. Romayne Alderman, [facility], disclaims any warranty or liability for your use of this information.
